# Patient Record
Sex: Female | Born: 1960 | Race: White | Hispanic: No | Marital: Single | Smoking: Never smoker
Health system: Southern US, Community
[De-identification: ages and names within clinical notes are randomized; demographics above are authoritative.]

## PROBLEM LIST (undated history)

## (undated) DIAGNOSIS — E785 Hyperlipidemia, unspecified: Secondary | ICD-10-CM

## (undated) DIAGNOSIS — B019 Varicella without complication: Secondary | ICD-10-CM

## (undated) DIAGNOSIS — R7303 Prediabetes: Secondary | ICD-10-CM

## (undated) DIAGNOSIS — N63 Unspecified lump in unspecified breast: Secondary | ICD-10-CM

## (undated) HISTORY — DX: Unspecified lump in unspecified breast: N63.0

## (undated) HISTORY — PX: BREAST LUMPECTOMY: SHX2

## (undated) HISTORY — DX: Prediabetes: R73.03

## (undated) HISTORY — DX: Hyperlipidemia, unspecified: E78.5

## (undated) HISTORY — DX: Varicella without complication: B01.9

---

## 2006-09-21 ENCOUNTER — Encounter: Admission: RE | Admit: 2006-09-21 | Discharge: 2006-09-21 | Payer: Self-pay | Admitting: Family Medicine

## 2007-01-31 ENCOUNTER — Ambulatory Visit (HOSPITAL_COMMUNITY): Admission: RE | Admit: 2007-01-31 | Discharge: 2007-01-31 | Payer: Self-pay | Admitting: General Surgery

## 2007-01-31 ENCOUNTER — Encounter (INDEPENDENT_AMBULATORY_CARE_PROVIDER_SITE_OTHER): Payer: Self-pay | Admitting: General Surgery

## 2007-06-22 DIAGNOSIS — N63 Unspecified lump in unspecified breast: Secondary | ICD-10-CM

## 2007-06-22 HISTORY — PX: BREAST LUMPECTOMY: SHX2

## 2007-06-22 HISTORY — PX: BREAST SURGERY: SHX581

## 2007-06-22 HISTORY — DX: Unspecified lump in unspecified breast: N63.0

## 2007-11-27 ENCOUNTER — Ambulatory Visit (HOSPITAL_COMMUNITY): Admission: RE | Admit: 2007-11-27 | Discharge: 2007-11-27 | Payer: Self-pay | Admitting: Obstetrics

## 2007-11-27 ENCOUNTER — Encounter (INDEPENDENT_AMBULATORY_CARE_PROVIDER_SITE_OTHER): Payer: Self-pay | Admitting: Obstetrics

## 2010-11-03 NOTE — Op Note (Signed)
NAMEBELA, BONAPARTE              ACCOUNT NO.:  [id number]   MEDICAL RECORD NO.:  [id number]          PATIENT TYPE:  OIB   LOCATION:  9305                          FACILITY:  [facility]   PHYSICIAN:  Lendon Colonel, MD   DATE OF BIRTH:  [birth year]   DATE OF PROCEDURE:  11/27/2007  DATE OF DISCHARGE:  11/27/2007                               OPERATIVE REPORT   PREOPERATIVE DIAGNOSES:  1. Symptomatic fibroids.  2. Pelvic pain.   POSTOPERATIVE DIAGNOSES:  1. Symptomatic fibroids.  2. Pelvic pain.   PROCEDURE:  Total laparoscopic hysterectomy.   SURGEON:  Lendon Colonel, MD.   ASSISTANT:  Dr. Earlene Plater.   ANESTHESIA:  General.   FINDINGS:  Large fibroid uterus.  Normal cervix, normal tubes, normal  ovaries, normal pelvis, normal liver edge, and normal gallbladder.   DISPOSITION:  Specimens to Pathology.   ESTIMATED BLOOD LOSS:  150 mL.   COMPLICATIONS:  None.   ANTIBIOTICS:  Ancef 2 g.   INDICATIONS:  This is a 50 year old female with known fibroids and  pelvic pressure from fibroids.  Risks, benefits, and alternatives to  treatment were discussed with the patient and after 3 months of Lupron  for shrinkage of fibroids, a total laparoscopic hysterectomy was  planned.   PROCEDURE:  After informed consent was obtained, the patient was taken  to the operating room, where general anesthesia was administered without  difficulty.  A Foley catheter was placed.  She was prepped and draped in  a normal sterile fashion in dorsal supine lithotomy position.  A bimanual examination was done to assess the size and position of the  uterus, limited mobility was noted.  A sterile speculum was inserted  into the vagina.  A single-tooth tenaculum was used to grasp the  anterior lip of the cervix.  The uterus was sounded to 8 cm and the RUMI  Eather Colas was inserted into the uterus.  Intrauterine balloon was employed and  the small [employer] ring was applied to the cervix.  Of note, this  procedure  was difficult due to the patient's narrow pelvis and limited  elasticity of the vagina; however, was carried out with no  complications.  A 10-mm skin incision was made in the infraumbilical  fold after infusing with 0.5% plain Marcaine.  The underlying layer of  fascia was dissected bluntly.  The fascia was grasped with Kocher clamps  x2 and the fascia was incised sharply with the knife.  The peritoneum  was entered bluntly.  The fascia was stitched with a 0 Vicryl in a  pursestring fashion and then a 10-mm Hasson trocar was inserted into the  peritoneal cavity, intraperitoneal placement was confirmed.  A  pneumoperitoneum was created with 50 mmHg and CO2 gas and the  laparoscope was introduced.  A brief survey of the patient's abdomen and  pelvis revealed a large soft fibroid uterus and limited mobility to the  uterus.  A 10-mm skin incision was made in the left lower quadrant and 6  cm below the 10-mm incision in the second left lower quadrant incision,  just medial and  superior to the anterior-superior iliac spine.  A 5-mm  skin incision was made, these were made with the scalpel after 0.5%  Marcaine and trocar placements were done under direct visualization.  Nonbladed trocars were used, the similar 5 mm right lower quadrant  trocar was placed after Marcaine.  Grasping instruments were placed  through these ports for easy visualization of the patient's abdomen and  pelvis.  The left round ligament was serially grasped and transected  with the Harmonic scalpel.  A bladder blade was created with the  Harmonic scalpel as the anterior leaf of the broad ligament was  dissected away, just superior to the bladder.  The uterine-ovarian  ligament was then serially cauterized with the bipolar cautery and then  transected with the Harmonic scalpel.  The left uterine artery was  skeletonized with the combination of the bipolar cautery and the  Harmonic scalpel.  Once the left ovary was free from  the uterus and the  uterine artery was skeletonized, attention was turned to the right  adnexa.  The Harmonic scalpel was used to serially ligate and divide the  round ligament, carried down to the anterior leaf of the broad ligaments  to create a bladder flap at the new right utero-ovarian ligament and  vessels.  The large structures were serially cauterized with bipolar  cautery prior to division with the Harmonic scalpel.  The right uterine  arteries were serially skeletonized and then cauterized with bipolar  prior to transecting with the Harmonic scalpel.  Of note, to better  mobilize the uterus, a laparoscopic tenaculum was used to place tension  on structures prior to the cautery and division.  At the level of the  cervix, the new bladder flap was created, ensured free of any attachment  and the Harmonic scalpel was used to transect the vagina just below the  external cervical os.  The Harmonic scalpel was used to divide the full-  thickness of the vagina and the incision was extended along the metal  Koh ring.  Of note, prior to beginning dissection of the vaginal  epithelium, the Koh ring was changed from the blue plastic Koh ring to  the metal Koh ring as it was apparent that we would use Harmonic scalpel  for the dissection of the vaginal cuff.  Coming from the anterior to the  left side then around to the right side, the vagina was divided just  below the level of the external cervical os.  Due to the limited  visualization of the posterior vagina and cervix, the uterus had to be  trimmed to allow more anteflexion of the uterus and to allow  visualization of the posterior vagina.  This was thought necessary to  safely make the posterior colpotomy.  Instruments were removed.  At the  left lower quadrant, a 11 mm port was removed and the morcellator was  introduced into the abdomen under direct visualization, about 6 passes  with the morcellator were done to shave off the  anterior and fundal  aspect of the uterus.  The uterus was then more mobile and the posterior  colpotomy was able to be made with good visualization.  Once the uterus  was completely transected, the uterus was pushed out through the vagina  bivalving and piecing of the uterus was done vaginally in order to  facilitate removal.  The 11-mm trocar was then placed back into the  abdomen and the morcellator was removed.  A 0 Vicryl Quill suture was  cut in 2 pieces with Lapra-Ty placed at each ends and the Quill suture  was placed through the 11 mm left lower quadrant port and a figure-of-  eight suture was placed at the apex on the left side with an additional  running suture, excess suture was trimmed.  The right apex was closed  with figure-of-eight suture with a second piece of Quill suture with a  Lapra-Ty at the end and one more suture was used to run and close the  cuff.  The cuff was noted to be completely closed and hemostatic.  All  cut vessels were found to be hemostatic.  The utero-ovarian ligaments  were reassessed and found to be hemostatic.  The vaginal cuff was  hemostatic.  The ovaries were noted to be normal with no cyst.  Of note,  the uterus were noted prior to beginning any cautery.  The pelvis was  irrigated with warmed normal saline.  The 11 mm left lower quadrant port  site was closed with a single suture of 0 Vicryl using an Endoclose  device.  The umbilical port had a pursestring sutures closed, all  pneumoperitoneum was released from the abdomen and the two 5-mm ports  were closed with Dermabond.  The two 10-mm ports were closed with  running 4-0 Vicryl in subcuticular fashion and Dermabond was  placed over these.  The Foley catheter was removed.  The vaginal  epithelium was inspected.  Several small abrasions were noted; however,  the vagina was noted to be hemostatic.  The patient was awakened from  general anesthesia and taken to the recovery room in stable  condition.      Lendon Colonel, MD  Electronically Signed     KAF/MEDQ  D:  11/27/2007  T:  11/28/2007  Job:  [id number]

## 2010-11-06 NOTE — Op Note (Signed)
Madeline Russell, Madeline Russell              ACCOUNT NO.:  [id number]   MEDICAL RECORD NO.:  [id number]          PATIENT TYPE:  AMB   LOCATION:  DAY                          FACILITY:  [facility]   PHYSICIAN:  Ollen Gross. Vernell Morgans, M.D. DATE OF BIRTH:  December 17, 1960   DATE OF PROCEDURE:  02/01/2007  DATE OF DISCHARGE:                               OPERATIVE REPORT   PREOPERATIVE DIAGNOSES:  Right breast mass.   POSTOPERATIVE DIAGNOSIS:  Right breast mass.   PROCEDURE:  Right breast lumpectomy.   SURGEON:  Ollen Gross. Vernell Morgans, M.D.   ANESTHESIA:  General.   DESCRIPTION OF PROCEDURE:  After informed consent was obtained, the  patient was brought to the operating room and placed in the supine  position on the operating table.  After adequate induction of general  anesthesia, the patient's right breast was prepped with Betadine and  draped in the usual sterile manner.  The patient had a palpable mass in  the medial aspect of the right breast at the level of the nipple.  A  transverse incision was made overlying this mass.  This incision was  carried down through the skin and into the subcutaneous tissue of the  breast sharply with electrocautery. Dissection was then carried out  around this palpable mass.  This was all done sharply with  electrocautery.  The dissection was carried down to the chest wall and  the mass was removed from the chest wall.  Once the mass was completely  separated from the rest of the subcutaneous tissue, it was removed and  sent to pathology for further evaluation.  Hemostasis was achieved using  Bovie electrocautery.  The wound was irrigated with copious amounts of  saline.  The deep layer of the wound was then closed with interrupted 3-  0 Vicryl stitches and the skin was closed with running 4-0 Monocryl  subcuticular stitch.  Benzoin, Steri-Strips and sterile dressings were  applied.  The patient tolerated the procedure well.  At the end of the  case, all needle, sponge, and  instrument counts were correct.  The  patient was then awakened and taken to recovery in stable condition.      Ollen Gross. Vernell Morgans, M.D.  Electronically Signed     PST/MEDQ  D:  02/01/2007  T:  02/02/2007  Job:  [id number]

## 2010-11-23 ENCOUNTER — Encounter (INDEPENDENT_AMBULATORY_CARE_PROVIDER_SITE_OTHER): Payer: Self-pay | Admitting: General Surgery

## 2011-02-11 ENCOUNTER — Encounter (INDEPENDENT_AMBULATORY_CARE_PROVIDER_SITE_OTHER): Payer: Self-pay | Admitting: General Surgery

## 2011-02-11 ENCOUNTER — Ambulatory Visit (INDEPENDENT_AMBULATORY_CARE_PROVIDER_SITE_OTHER): Payer: 59 | Admitting: General Surgery

## 2011-02-11 VITALS — BP 104/80 | HR 68

## 2011-02-11 DIAGNOSIS — Z853 Personal history of malignant neoplasm of breast: Secondary | ICD-10-CM

## 2011-02-11 NOTE — Patient Instructions (Signed)
Continue regular self exams  

## 2011-02-16 NOTE — Progress Notes (Signed)
Subjective:     Patient ID: Madeline Russell, female   DOB: [birth year], 50 y.o.   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 Her last mammogram was in July and appeared benign. Since her last visit she has had no complaints. She denies any breast pain or discharge from her nipple. Review of Systems     Objective:   Physical Exam On exam Lungs: Clear bilaterally with no use of accessory respiratory muscles Heart: Regular rate and rhythm with an impulse in the left chest Abdomen: Soft and nontender with no palpable mass or hepatosplenomegaly Breasts: No palpable mass in either breast. No axillary supraclavicular or cervical lymphadenopathy.    Assessment:     4 years postop from excision of a phylloides tumor    Plan:     She will continue to perform regular self exams. We will plan to see her back on a yearly basis. She agrees to call associate has any problems in the meantime.

## 2011-03-18 LAB — PREGNANCY, URINE

## 2011-03-18 LAB — ABO/RH: ABO/RH(D): A POS

## 2011-03-18 LAB — TYPE AND SCREEN
ABO/RH(D): A POS
Antibody Screen: NEGATIVE

## 2011-03-18 LAB — BASIC METABOLIC PANEL
Calcium: 9.6
GFR calc non Af Amer: >60
Potassium: 4
Sodium: 138

## 2011-03-18 LAB — CBC
RBC: 4.8
WBC: 4.4

## 2011-08-03 ENCOUNTER — Emergency Department (HOSPITAL_COMMUNITY)
Admission: EM | Admit: 2011-08-03 | Discharge: 2011-08-03 | Disposition: A | Discharge status: Home / Self Care | Payer: Worker's Compensation | Attending: Emergency Medicine | Admitting: Emergency Medicine

## 2011-08-03 ENCOUNTER — Emergency Department (HOSPITAL_COMMUNITY): Payer: Worker's Compensation

## 2011-08-03 ENCOUNTER — Encounter (HOSPITAL_COMMUNITY): Payer: Self-pay

## 2011-08-03 DIAGNOSIS — Y990 Civilian activity done for income or pay: Secondary | ICD-10-CM | POA: Insufficient documentation

## 2011-08-03 DIAGNOSIS — S0990XA Unspecified injury of head, initial encounter: Secondary | ICD-10-CM | POA: Insufficient documentation

## 2011-08-03 DIAGNOSIS — IMO0002 Reserved for concepts with insufficient information to code with codable children: Secondary | ICD-10-CM | POA: Insufficient documentation

## 2011-08-03 MED ORDER — HYDROCODONE-ACETAMINOPHEN 5-325 MG PO TABS
1.0000 | ORAL_TABLET | Freq: Once | ORAL | Status: AC
  Administered 2011-08-03: 1 via ORAL
  Filled 2011-08-03: qty 1

## 2011-08-03 MED ORDER — HYDROCODONE-ACETAMINOPHEN 5-325 MG PO TABS: 2.0000 | ORAL_TABLET | ORAL | Status: AC | PRN

## 2011-08-03 MED ORDER — IBUPROFEN 800 MG PO TABS
800.0000 mg | ORAL_TABLET | Freq: Once | ORAL | Status: AC
  Administered 2011-08-03: 800 mg via ORAL
  Filled 2011-08-03: qty 1
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
## 2011-08-03 NOTE — ED Notes (Signed)
Pt has been marked "confidential" however pt has said that her brother, Thayer Ohm may be calling and it's ok for him to know she's here.

## 2011-08-03 NOTE — Discharge Instructions (Signed)
Please read over the instructions below. The Ct of your head tonight is normal. Your exam is also normal. We are prescribing a short course of medication for pain to use, if needed,  over the next couple of days as you may be very sore tomorrow or the next day. Return if you have worsening symptoms, otherwise follow up with Dr.Hunt per the recommendations of your  Worker's Comp guidelines.   Head Injury, Adult  You have had a head injury that does not appear serious at this time. A concussion is a state of changed mental ability, usually from a blow to the head. You should take clear liquids for the rest of the day and then resume your regular diet. You should not take sedatives or alcoholic beverages for as long as directed by your caregiver after discharge. After injuries such as yours, most problems occur within the first 24 hours. SYMPTOMS These minor symptoms may be experienced after discharge:  Memory difficulties.   Dizziness.   Headaches.   Double vision.   Hearing difficulties.   Depression.   Tiredness.   Weakness.   Difficulty with concentration.  If you experience any of these problems, you should not be alarmed. A concussion requires a few days for recovery. Many patients with head injuries frequently experience such symptoms. Usually, these problems disappear without medical care. If symptoms last for more than one day, notify your caregiver. See your caregiver sooner if symptoms are becoming worse rather than better. HOME CARE INSTRUCTIONS   During the next 24 hours you must stay with someone who can watch you for the warning signs listed below.  Although it is unlikely that serious side effects will occur, you should be aware of signs and symptoms which may necessitate your return to this location. Side effects may occur up to 7 - 10 days following the injury. It is important for you to carefully monitor your condition and contact your caregiver or seek immediate medical  attention if there is a change in your condition. SEEK IMMEDIATE MEDICAL CARE IF:   There is confusion or drowsiness.   You can not awaken the injured person.   There is nausea (feeling sick to your stomach) or continued, forceful vomiting.   You notice dizziness or unsteadiness which is getting worse, or inability to walk.   You have convulsions or unconsciousness.   You experience severe, persistent headaches not relieved by over-the-counter or prescription medicines for pain. (Do not take aspirin as this impairs clotting abilities). Take other pain medications only as directed.   You can not use arms or legs normally.   There is clear or bloody discharge from the nose or ears.  MAKE SURE YOU:   Understand these instructions.   Will watch your condition.   Will get help right away if you are not doing well or get worse.  Document Released: 06/07/2005 Document Revised: 02/17/2011 Document Reviewed: 04/25/2009 [facility] Patient Information 2012 [place], [place].
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
## 2011-08-04 NOTE — ED Provider Notes (Signed)
Medical screening examination/treatment/procedure(s) were performed by non-physician practitioner and as supervising physician I was immediately available for consultation/collaboration.  Raeford Razor, MD 08/04/11 [phone]

## 2011-12-01 ENCOUNTER — Encounter (INDEPENDENT_AMBULATORY_CARE_PROVIDER_SITE_OTHER): Payer: Self-pay | Admitting: General Surgery

## 2012-01-24 ENCOUNTER — Ambulatory Visit (INDEPENDENT_AMBULATORY_CARE_PROVIDER_SITE_OTHER): Payer: 59 | Admitting: General Surgery

## 2012-01-24 ENCOUNTER — Encounter (INDEPENDENT_AMBULATORY_CARE_PROVIDER_SITE_OTHER): Payer: Self-pay | Admitting: General Surgery

## 2012-01-24 VITALS — BP 102/60 | HR 84 | Temp 97.0°F | Resp 20 | Ht 66.75 in | Wt 172.6 lb

## 2012-01-24 DIAGNOSIS — Z853 Personal history of malignant neoplasm of breast: Secondary | ICD-10-CM

## 2012-01-24 NOTE — Progress Notes (Signed)
Subjective:     Patient ID: Madeline Russell, female   DOB: 01/26/1961, 51 y.o.   MRN: [id number]  HPI The patient is a 51 year old white female who is about 5 years out from a right breast lumpectomy for a low-grade phylloides tumor. Since her last visit she has been well. She denies any breast pain. She denies any discharge from her nipple. She recently had a mammogram that showed no evidence of malignancy.  Review of Systems  Constitutional: Negative.   HENT: Negative.   Eyes: Negative.   Respiratory: Negative.   Cardiovascular: Negative.   Gastrointestinal: Negative.   Genitourinary: Negative.   Musculoskeletal: Negative.   Skin: Negative.   Neurological: Negative.   Hematological: Negative.   Psychiatric/Behavioral: Negative.        Objective:   Physical Exam  Constitutional: She is oriented to person, place, and time. She appears well-developed and well-nourished.  HENT:  Head: Normocephalic and atraumatic.  Eyes: Conjunctivae and EOM are normal. Pupils are equal, round, and reactive to light.  Neck: Normal range of motion. Neck supple.  Cardiovascular: Normal rate, regular rhythm and normal heart sounds.   Pulmonary/Chest: Effort normal and breath sounds normal.       Her right breast incision is healing nicely. There is no palpable mass in either breast. No palpable axillary supraclavicular or cervical lymphadenopathy.  Abdominal: Soft. Bowel sounds are normal. She exhibits no mass. There is no tenderness.  Musculoskeletal: Normal range of motion.  Lymphadenopathy:    She has no cervical adenopathy.  Neurological: She is alert and oriented to person, place, and time.  Skin: Skin is warm and dry.  Psychiatric: She has a normal mood and affect. Her behavior is normal.       Assessment:     5 years status post right lumpectomy for a low-grade phylloides tumor    Plan:     At this point she will continue to do regular self exams. We will plan to see her back in  one year

## 2012-01-24 NOTE — Patient Instructions (Signed)
Continue regular self exams  

## 2012-12-13 ENCOUNTER — Encounter (INDEPENDENT_AMBULATORY_CARE_PROVIDER_SITE_OTHER): Payer: Self-pay | Admitting: General Surgery

## 2013-01-31 ENCOUNTER — Encounter (INDEPENDENT_AMBULATORY_CARE_PROVIDER_SITE_OTHER): Payer: Self-pay

## 2013-02-13 ENCOUNTER — Ambulatory Visit (INDEPENDENT_AMBULATORY_CARE_PROVIDER_SITE_OTHER): Payer: 59 | Admitting: General Surgery

## 2013-02-15 ENCOUNTER — Encounter (INDEPENDENT_AMBULATORY_CARE_PROVIDER_SITE_OTHER): Payer: Self-pay | Admitting: General Surgery

## 2013-02-15 ENCOUNTER — Ambulatory Visit (INDEPENDENT_AMBULATORY_CARE_PROVIDER_SITE_OTHER): Payer: 59 | Admitting: General Surgery

## 2013-02-15 DIAGNOSIS — Z853 Personal history of malignant neoplasm of breast: Secondary | ICD-10-CM

## 2013-02-15 NOTE — Patient Instructions (Signed)
Continue regular self exams  
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
## 2013-05-14 ENCOUNTER — Emergency Department (HOSPITAL_BASED_OUTPATIENT_CLINIC_OR_DEPARTMENT_OTHER)
Admission: EM | Admit: 2013-05-14 | Discharge: 2013-05-14 | Disposition: A | Discharge status: Home / Self Care | Payer: 59 | Attending: Emergency Medicine | Admitting: Emergency Medicine

## 2013-05-14 ENCOUNTER — Other Ambulatory Visit: Payer: Self-pay

## 2013-05-14 ENCOUNTER — Emergency Department (HOSPITAL_BASED_OUTPATIENT_CLINIC_OR_DEPARTMENT_OTHER): Payer: 59

## 2013-05-14 ENCOUNTER — Encounter (HOSPITAL_BASED_OUTPATIENT_CLINIC_OR_DEPARTMENT_OTHER): Payer: Self-pay | Admitting: Emergency Medicine

## 2013-05-14 DIAGNOSIS — Z8742 Personal history of other diseases of the female genital tract: Secondary | ICD-10-CM | POA: Insufficient documentation

## 2013-05-14 DIAGNOSIS — Z79899 Other long term (current) drug therapy: Secondary | ICD-10-CM | POA: Insufficient documentation

## 2013-05-14 DIAGNOSIS — R0789 Other chest pain: Secondary | ICD-10-CM | POA: Insufficient documentation

## 2013-05-14 LAB — CBC WITH DIFFERENTIAL/PLATELET
Basophils Absolute: 0 10*3/uL (ref 0.0–0.1)
Eosinophils Relative: 2 % (ref 0–5)
HCT: 40 % (ref 36.0–46.0)
Lymphocytes Relative: 49 % — ABNORMAL HIGH (ref 12–46)
MCH: 31.5 pg (ref 26.0–34.0)
MCHC: 33.8 g/dL (ref 30.0–36.0)
MCV: 93.2 fL (ref 78.0–100.0)
Monocytes Absolute: 0.4 10*3/uL (ref 0.1–1.0)
RDW: 12.1 % (ref 11.5–15.5)
WBC: 4.6 10*3/uL (ref 4.0–10.5)

## 2013-05-14 LAB — BASIC METABOLIC PANEL
CO2: 25 mEq/L (ref 19–32)
Calcium: 9.2 mg/dL (ref 8.4–10.5)
Creatinine, Ser: 0.7 mg/dL (ref 0.50–1.10)

## 2013-05-14 LAB — TROPONIN I: Troponin I: <0.3 ng/mL (ref <0.30)

## 2013-05-14 MED ORDER — PANTOPRAZOLE SODIUM 40 MG IV SOLR
40.0000 mg | Freq: Once | INTRAVENOUS | Status: AC
  Administered 2013-05-14: 40 mg via INTRAVENOUS
  Filled 2013-05-14: qty 40

## 2013-05-14 MED ORDER — OMEPRAZOLE 20 MG PO CPDR: 20.0000 mg | DELAYED_RELEASE_CAPSULE | Freq: Every day | ORAL | Status: DC

## 2013-05-14 MED ORDER — GI COCKTAIL ~~LOC~~
30.0000 mL | Freq: Once | ORAL | Status: AC
  Administered 2013-05-14: 30 mL via ORAL
  Filled 2013-05-14: qty 30

## 2013-05-14 MED ORDER — ASPIRIN 81 MG PO CHEW
324.0000 mg | CHEWABLE_TABLET | Freq: Once | ORAL | Status: AC
  Administered 2013-05-14: 324 mg via ORAL
  Filled 2013-05-14: qty 4

## 2013-05-14 NOTE — ED Notes (Signed)
Patient transported to X-ray via stretcher per tech. 

## 2013-05-14 NOTE — ED Provider Notes (Signed)
CSN: [id number]     Arrival date & time 05/14/13  0108 History   First MD Initiated Contact with Patient 05/14/13 0126     Chief Complaint  Patient presents with  . Chest Pain   (Consider location/radiation/quality/duration/timing/severity/associated sxs/prior Treatment) HPI 52 year old female with a two-day history of intermittent chest pain. She describes the chest pain as feeling like heartburn with a sharp component. It is substernal with some radiation to the back. It is worse with swallowing. It is not exacerbated by exertion or relieved by rest. The symptoms are mild to moderate at worst. She is also having some paresthesias of the left arm. She denies shortness of breath, diaphoresis, nausea or vomiting. She did take 81 mg of aspirin 2 days ago.  Past Medical History  Diagnosis Date  . Breast lump 2009    right   Past Surgical History  Procedure Laterality Date  . Breast surgery  2009    RIGHT  . Breast lumpectomy      NOT CANCER  . Breast lumpectomy  2009    right   Family History  Problem Relation Age of Onset  . Cancer Mother     leukemia  . Heart disease Father    History  Substance Use Topics  . Smoking status: Never Smoker   . Smokeless tobacco: Never Used  . Alcohol Use: No   OB History   Grav Para Term Preterm Abortions TAB SAB Ect Mult Living                 Review of Systems  All other systems reviewed and are negative.    Allergies  Review of patient's allergies indicates no known allergies.  Home Medications   Current Outpatient Rx  Name  Route  Sig  Dispense  Refill  . cholecalciferol (VITAMIN D) 1000 UNITS tablet   Oral   Take 1,000 Units by mouth daily.         . diphenhydrAMINE (BENADRYL) 25 MG tablet   Oral   Take 25 mg by mouth every 6 (six) hours as needed. For allergies.         Marland Kitchen loratadine (CLARITIN) 10 MG tablet   Oral   Take 10 mg by mouth daily as needed. For allergies.         . Multiple Vitamin (MULITIVITAMIN  WITH MINERALS) TABS   Oral   Take 1 tablet by mouth daily.          BP 142/86  Pulse 62  Temp(Src) 97.9 F (36.6 C) (Oral)  Resp 18  Ht 5\' 7"  (1.702 m)  Wt 160 lb (72.576 kg)  BMI 25.05 kg/m2  SpO2 98%  Physical Exam General: Well-developed, well-nourished female in no acute distress; appearance consistent with age of record HENT: normocephalic; atraumatic Eyes: pupils equal, round and reactive to light; extraocular muscles intact Neck: supple Heart: regular rate and rhythm; no murmurs, rubs or gallops Lungs: clear to auscultation bilaterally Abdomen: soft; nondistended; nontender; bowel sounds present Extremities: No deformity; full range of motion; pulses normal; no edema Neurologic: Awake, alert and oriented; motor function intact in all extremities and symmetric; no facial droop Skin: Warm and dry Psychiatric: Normal mood and affect    ED Course  Procedures (including critical care time)   MDM  Nursing notes and vitals signs, including pulse oximetry, reviewed.  Summary of this visit's results, reviewed by myself:  Labs:  Results for orders placed during the hospital encounter of 05/14/13 (from the past 24  hour(s))  CBC WITH DIFFERENTIAL     Status: Abnormal   Collection Time    05/14/13  1:33 AM      Result Value Range   WBC 4.6  4.0 - 10.5 K/uL   RBC 4.29  3.87 - 5.11 MIL/uL   Hemoglobin 13.5  12.0 - 15.0 g/dL   HCT 29.5  62.1 - 30.8 %   MCV 93.2  78.0 - 100.0 fL   MCH 31.5  26.0 - 34.0 pg   MCHC 33.8  30.0 - 36.0 g/dL   RDW 65.7  84.6 - 96.2 %   Platelets 265  150 - 400 K/uL   Neutrophils Relative % 40 (*) 43 - 77 %   Neutro Abs 1.9  1.7 - 7.7 K/uL   Lymphocytes Relative 49 (*) 12 - 46 %   Lymphs Abs 2.2  0.7 - 4.0 K/uL   Monocytes Relative 9  3 - 12 %   Monocytes Absolute 0.4  0.1 - 1.0 K/uL   Eosinophils Relative 2  0 - 5 %   Eosinophils Absolute 0.1  0.0 - 0.7 K/uL   Basophils Relative 1  0 - 1 %   Basophils Absolute 0.0  0.0 - 0.1 K/uL   BASIC METABOLIC PANEL     Status: Abnormal   Collection Time    05/14/13  1:33 AM      Result Value Range   Sodium 140  135 - 145 mEq/L   Potassium 3.7  3.5 - 5.1 mEq/L   Chloride 104  96 - 112 mEq/L   CO2 25  19 - 32 mEq/L   Glucose, Bld 111 (*) 70 - 99 mg/dL   BUN 13  6 - 23 mg/dL   Creatinine, Ser 9.52  0.50 - 1.10 mg/dL   Calcium 9.2  8.4 - 84.1 mg/dL   GFR calc non Af Amer >90  >90 mL/min   GFR calc Af Amer >90  >90 mL/min  TROPONIN I     Status: None   Collection Time    05/14/13  1:33 AM      Result Value Range   Troponin I <0.30  <0.30 ng/mL    Imaging Studies: Dg Chest 2 View  05/14/2013   CLINICAL DATA:  Substernal chest pain radiating to left arm.  EXAM: CHEST  2 VIEW  COMPARISON:  None.  FINDINGS: The heart size and mediastinal contours are within normal limits. Both lungs are clear. The visualized skeletal structures are unremarkable.  IMPRESSION: No active cardiopulmonary disease.   Electronically Signed   By: Myles Rosenthal M.D.   On: 05/14/2013 01:47      EKG Interpretation    Date/Time:  Monday May 14 2013 01:23:23 EST Ventricular Rate:  69 PR Interval:  148 QRS Duration: 76 QT Interval:  418 QTC Calculation: 447 R Axis:   52 Text Interpretation:  Normal sinus rhythm Normal ECG No previous ECGs available Confirmed by Kennieth Plotts  MD, Jerrica Thorman ([id number]) on 05/14/2013 1:29:17 AM     2:12 AM Significant relief with GI cocktail.  2:42 AM History is atypical for coronary etiology. She has no risk factors apart from coronary artery disease in her family. We'll treat for GERD and refer to [facility] cardiology for followup stress test. She was advised to return for worsening or changing symptoms.   Hanley Seamen, MD 05/14/13 [phone]

## 2013-05-14 NOTE — ED Notes (Signed)
MD at bedside. 

## 2013-05-14 NOTE — ED Notes (Signed)
Pt reports 2 days of intermittent cp substernal initially and then today radiated to left side and down left arm with numbness.  Denies additional symptoms.  Reports that when she had a drink today felt the pain radiating down the chest.

## 2014-02-07 ENCOUNTER — Other Ambulatory Visit: Payer: Self-pay | Admitting: Gastroenterology

## 2014-02-07 DIAGNOSIS — R110 Nausea: Secondary | ICD-10-CM

## 2014-02-12 ENCOUNTER — Ambulatory Visit
Admission: RE | Admit: 2014-02-12 | Discharge: 2014-02-12 | Disposition: A | Discharge status: Home / Self Care | Payer: 59 | Source: Ambulatory Visit | Attending: Gastroenterology | Admitting: Gastroenterology

## 2014-02-12 DIAGNOSIS — R110 Nausea: Secondary | ICD-10-CM

## 2014-02-18 ENCOUNTER — Encounter: Payer: Self-pay | Admitting: General Surgery

## 2014-02-21 ENCOUNTER — Ambulatory Visit (INDEPENDENT_AMBULATORY_CARE_PROVIDER_SITE_OTHER): Payer: 59 | Admitting: General Surgery

## 2015-06-08 IMAGING — US US ABDOMEN COMPLETE
1 series · 14 of 25 positions shown · non-contrast
Comparison: None.

CLINICAL DATA: Nausea.  Acid reflux.

EXAM:
ULTRASOUND ABDOMEN COMPLETE

[Series 1: us abdomen complete · 0.24mm/px · 14 of 80 slices shown]
[im 1/80]
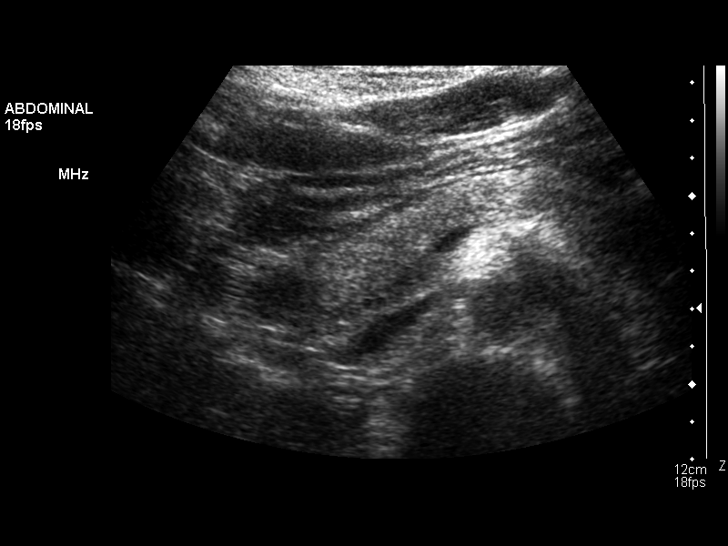
[im 7/80]
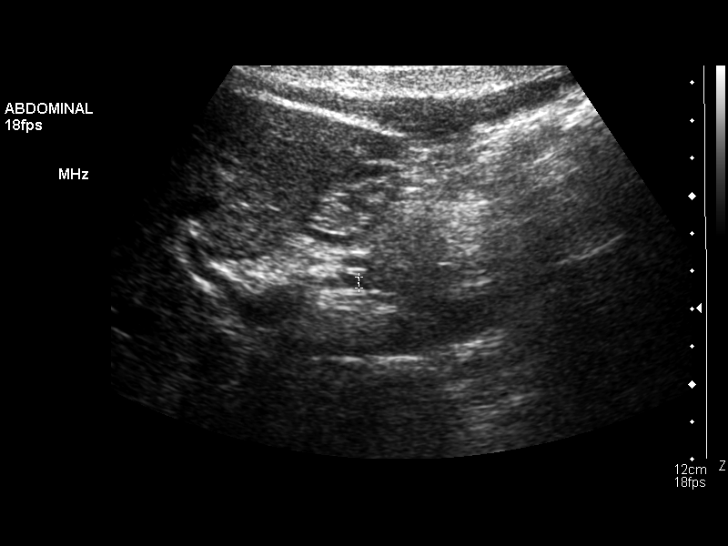
[im 14/80]
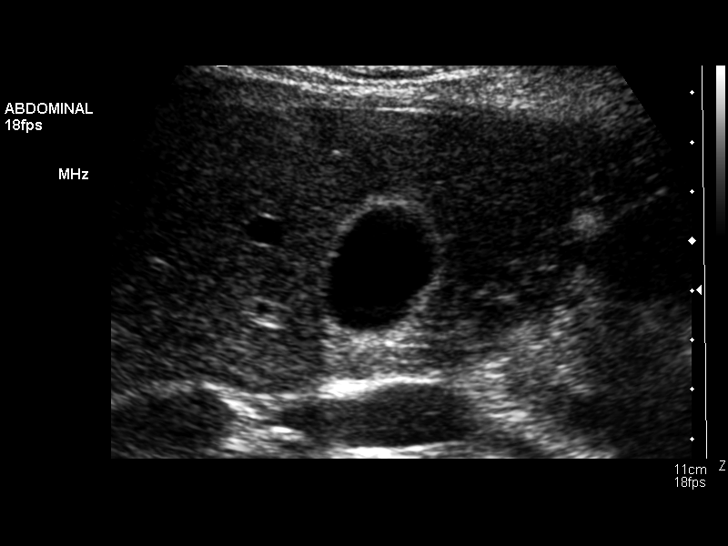
[im 20/80]
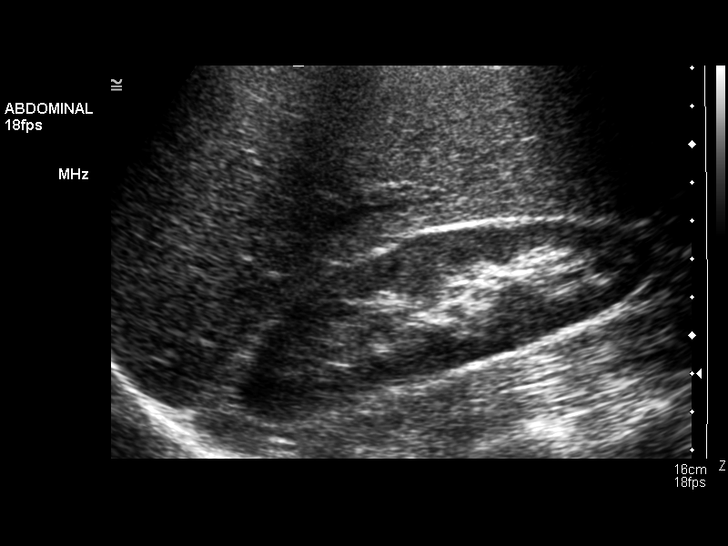
[im 27/80]
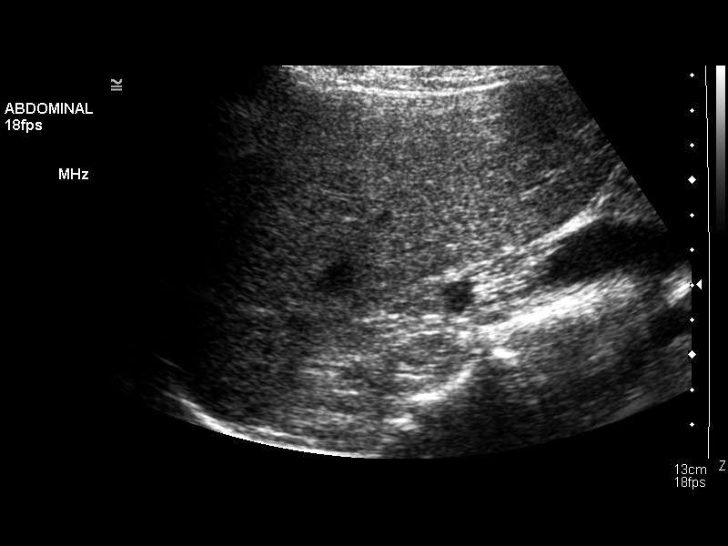
[im 30/80]
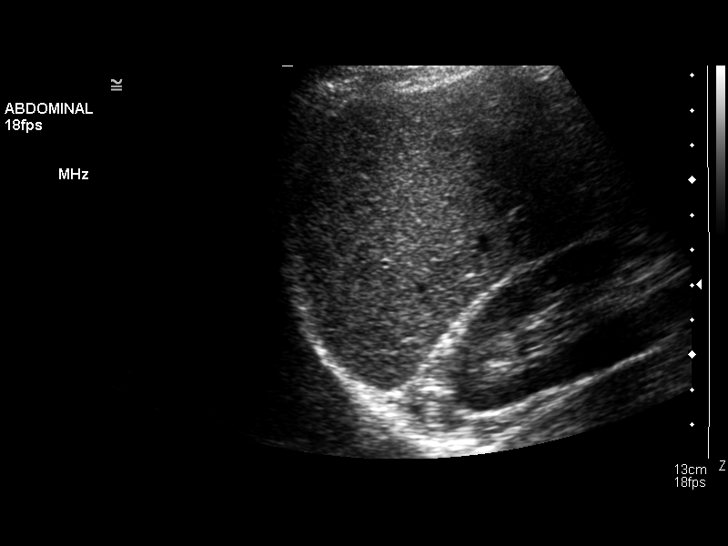
[im 37/80]
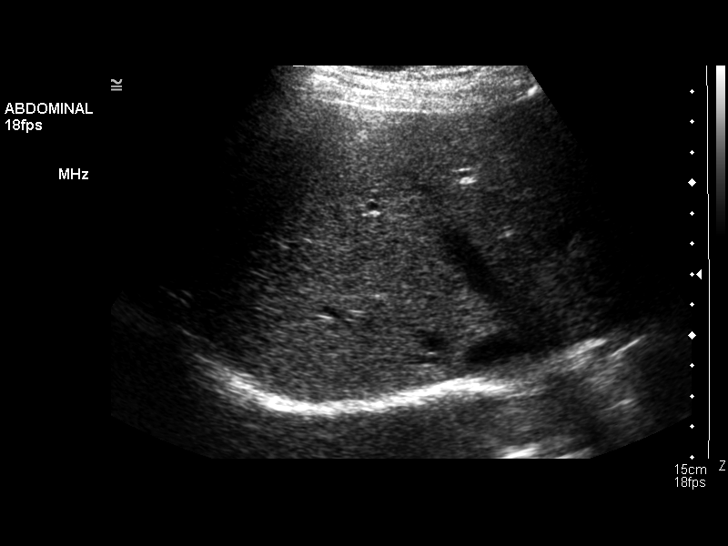
[im 43/80]
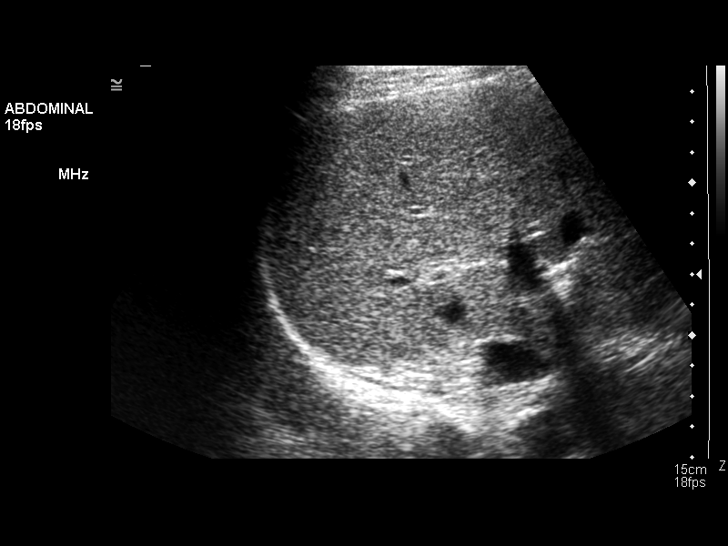
[im 50/80]
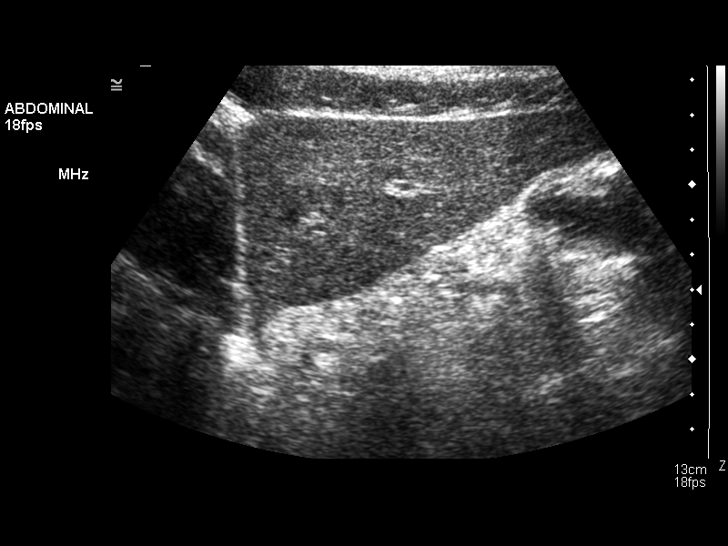
[im 53/80]
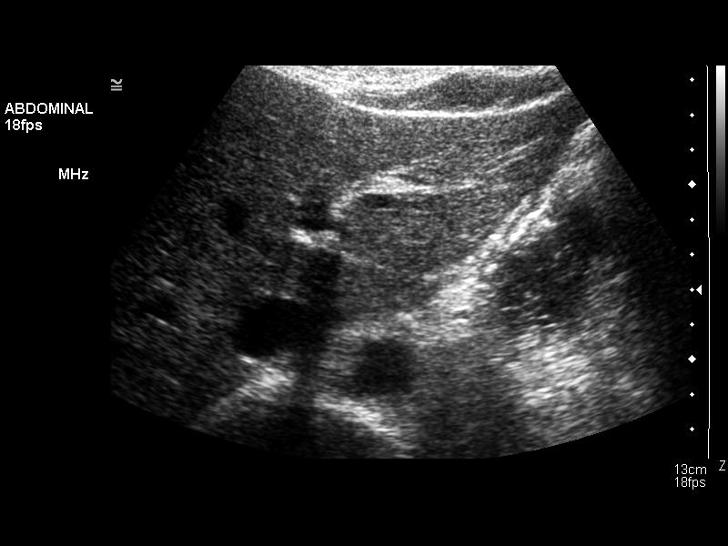
[im 60/80]
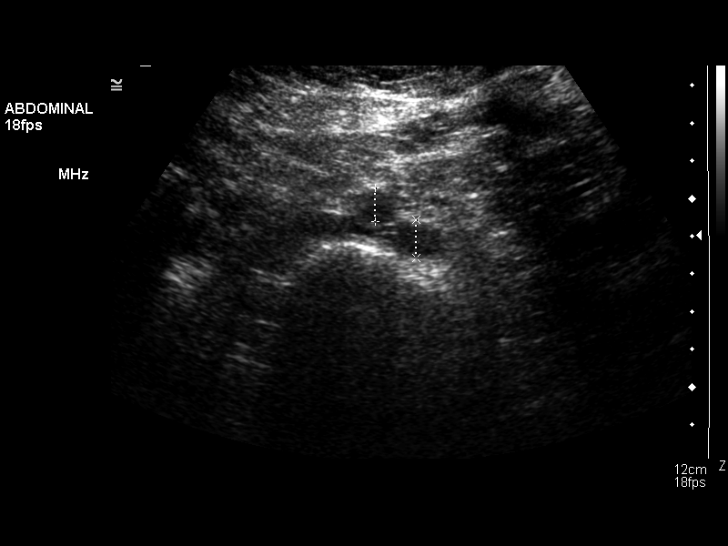
[im 66/80]
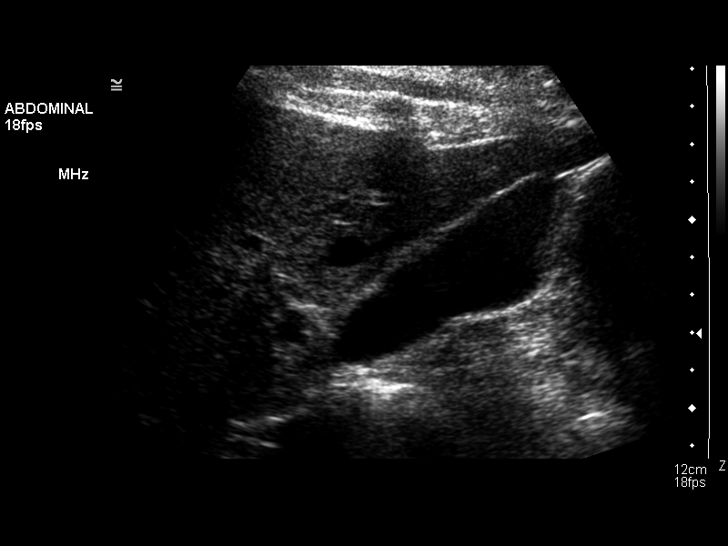
[im 73/80]
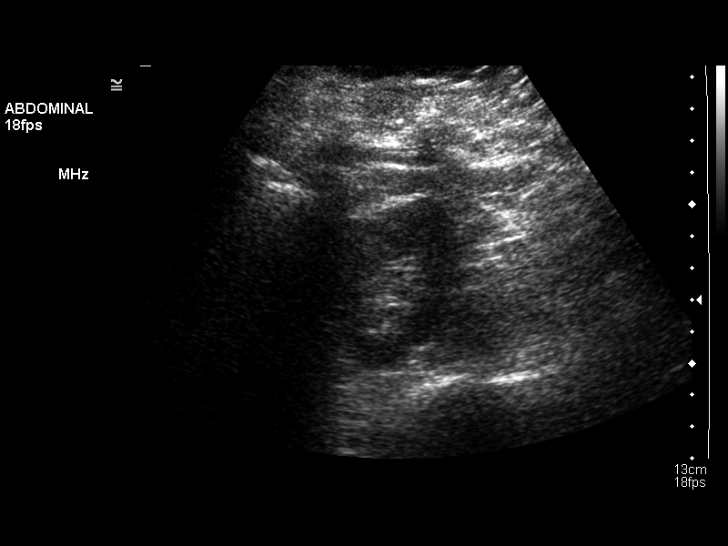
[im 80/80]
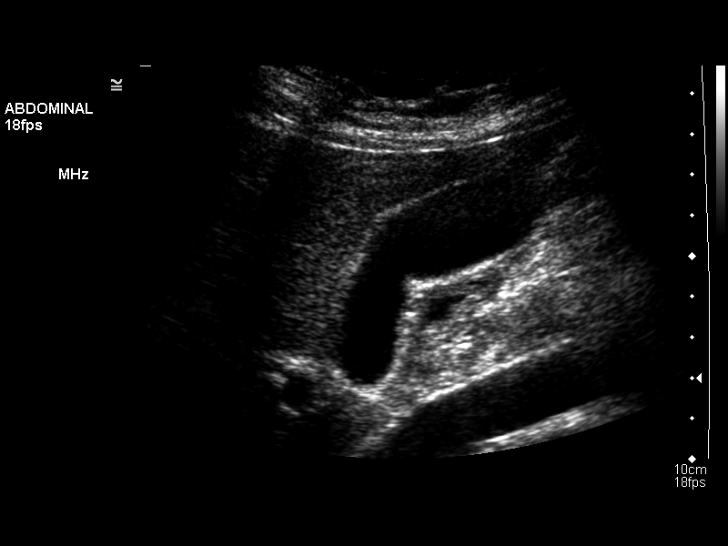

[14 of 25 positions shown; findings below may reference images not displayed]

FINDINGS: Gallbladder:

No gallstones or wall thickening visualized. No sonographic Murphy
sign noted.

Common bile duct:

Diameter: 2.9 cm.

Liver:

No focal lesion identified. Within normal limits in parenchymal
echogenicity.

IVC:

No abnormality visualized.

Pancreas:

Visualized portion unremarkable.

Spleen:

Size and appearance within normal limits.

Right Kidney:

Length: 11.6 cm. Echogenicity within normal limits. No mass or
hydronephrosis visualized.

Left Kidney:

Length: 11.5 cm.. Echogenicity within normal limits. No mass or
hydronephrosis visualized.

Abdominal aorta:

No aneurysm visualized.

Other findings:

None.
IMPRESSION: 1. No acute findings.
2. Normal exam.

## 2015-08-18 ENCOUNTER — Encounter: Payer: Self-pay | Admitting: Dietician

## 2015-08-18 ENCOUNTER — Encounter: Payer: Commercial Managed Care - HMO | Attending: Obstetrics | Admitting: Dietician

## 2015-08-18 VITALS — Ht 67.0 in | Wt 160.0 lb

## 2015-08-18 DIAGNOSIS — R739 Hyperglycemia, unspecified: Secondary | ICD-10-CM

## 2015-08-18 DIAGNOSIS — R7309 Other abnormal glucose: Secondary | ICD-10-CM | POA: Diagnosis present

## 2015-08-18 DIAGNOSIS — E785 Hyperlipidemia, unspecified: Secondary | ICD-10-CM

## 2015-08-18 NOTE — Patient Instructions (Signed)
Plan:  Aim for 3 Carb Choices per meal (45 grams) +/- 1 either way  Aim for 0-1 Carbs per snack if hungry  Include protein in moderation with your meals and snacks Consider reading food labels for Total Carbohydrate and Fat Grams of foods Consider  increasing your activity level by walking for 30 minutes daily as tolerated Great job on the changes that you have made!

## 2015-08-18 NOTE — Progress Notes (Signed)
  Medical Nutrition Therapy:  Appt start time: 1400 end time:  1500.   Assessment:  Primary concerns today: Patient is here with her friend.  She would like information to avoid diabetes.  Referral for elevated HgbA1C (6.1% increased from 5.8% 1 year ago), and elevated lipids:  Cholesterol 241, Triglycerides 86, HDL 62, LDL 162.  Vitamin D 34.5.  Since Madeline Russell found out about her labs, she has made multiple changes such as increasing her exercise and avoiding all sugar as well as reducing her carbohydrate intake.  She has lost from 170 lbs 07/15/15 to 160 lbs today.  "I am now eating to live."  "I do not want diabetes."  Patient lives alone.   She is very active.  She does her own shopping and cooking.  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    Preferred Learning Style:   No preference indicated   Learning Readiness:   Ready  Change in progress  MEDICATIONS: see list   DIETARY INTAKE:  Usual eating pattern includes 3 meals and 1-2 snacks per day. Everyday foods include baked.  Avoided foods include sugar.    24-hr recall:  B ( AM): black coffee with 1 cup plain cheerios and 12 honey nut cheerios with whole milk  Snk ( AM): none  L ( PM): Double fiber wheat bread with [place], lettuce, and mustard and small amount of cheese Snk ( PM): greek yogurt or apple D ( PM): salmon or chicken grilled, vegetables, salad Snk ( PM): none or occasional yogurt or peanuts Beverages: black coffee, diet lipton tea, water, mostly unsweetened tea  Usual physical activity: walks 15 minutes and runs 15 minutes, hikes and started to work out.  Estimated energy needs: 1600 calories 180 g carbohydrates 120 g protein 44 g fat  Progress Towards Goal(s):  In progress.   Nutritional Diagnosis:  NB-1.1 Food and nutrition-related knowledge deficit As related to balance of carbohydrates, protein, and fat.  As evidenced by patient  report due to newly increase lipids and glucose..    Intervention:  Nutrition counseling/education for increased lipids (decreased saturated fat), and diabetes education initiated. Discussed Carb Counting by food group as method of portion control, reading food labels, and benefits of increased activity. Also discussed basic physiology of Diabetes.   Plan:  Aim for 3 Carb Choices per meal (45 grams) +/- 1 either way  Aim for 0-1 Carbs per snack if hungry  Include protein in moderation with your meals and snacks Consider reading food labels for Total Carbohydrate and Fat Grams of foods Consider  increasing your activity level by walking for 30 minutes daily as tolerated Great job on the changes that you have made!  Teaching Method Utilized:  Visual Auditory Hands on  Handouts given during visit include: Living Well with Diabetes Carb Counting and Food Label handouts Meal Plan Card Label reading  My plate  Barriers to learning/adherence to lifestyle change: none  Demonstrated degree of understanding via:  Teach Back   Monitoring/Evaluation:  Dietary intake, exercise, label reading, and body weight prn.

## 2018-02-02 ENCOUNTER — Other Ambulatory Visit: Payer: Self-pay | Admitting: General Surgery

## 2018-02-02 DIAGNOSIS — R1011 Right upper quadrant pain: Secondary | ICD-10-CM

## 2018-03-02 ENCOUNTER — Ambulatory Visit
Admission: RE | Admit: 2018-03-02 | Discharge: 2018-03-02 | Disposition: A | Discharge status: Home / Self Care | Payer: 59 | Source: Ambulatory Visit | Attending: General Surgery | Admitting: General Surgery

## 2018-03-02 DIAGNOSIS — R1011 Right upper quadrant pain: Secondary | ICD-10-CM

## 2020-02-11 IMAGING — US US ABDOMEN LIMITED
1 series · 14 of 25 positions shown · non-contrast
Comparison: 02/12/2014

CLINICAL DATA: Right upper quadrant pain for several weeks

EXAM:
ULTRASOUND ABDOMEN LIMITED RIGHT UPPER QUADRANT

[Series 1: us abdomen limited · 0.17mm/px · 14 of 52 slices shown]
[im 1/52]
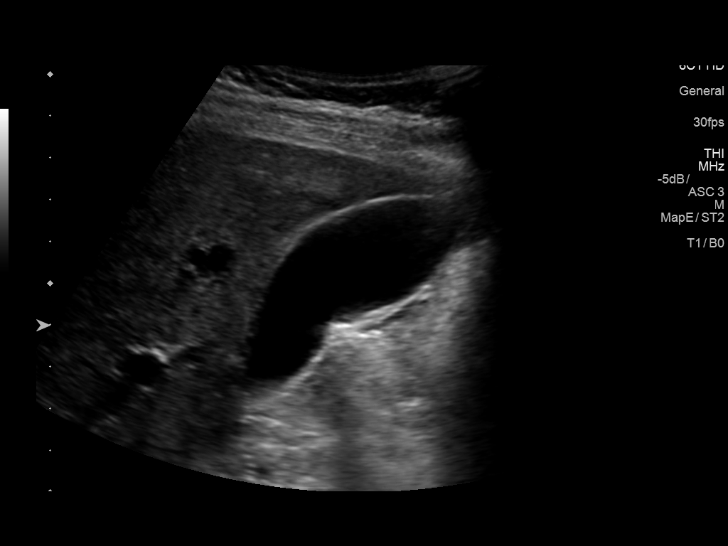
[im 5/52]
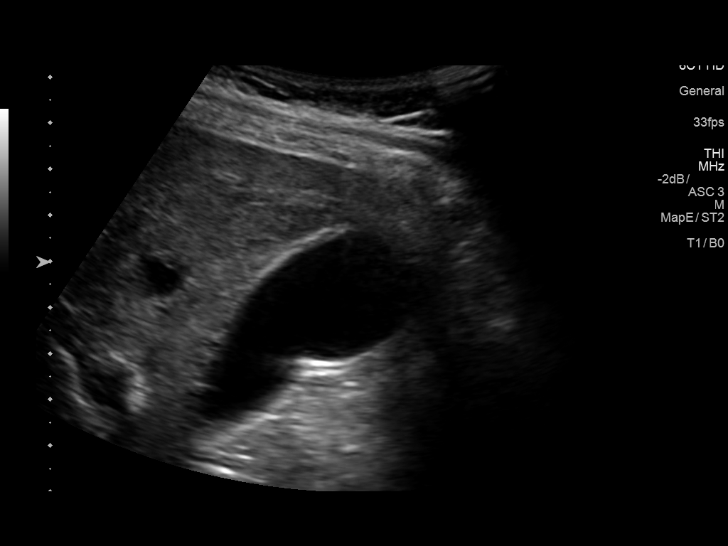
[im 9/52]
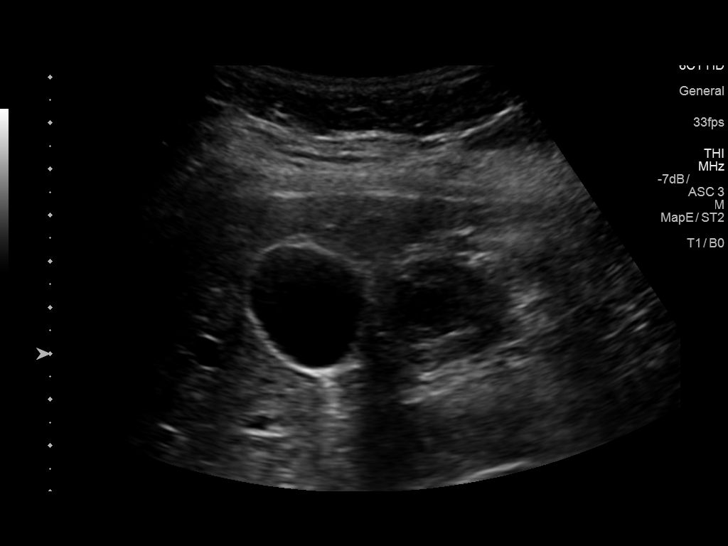
[im 13/52]
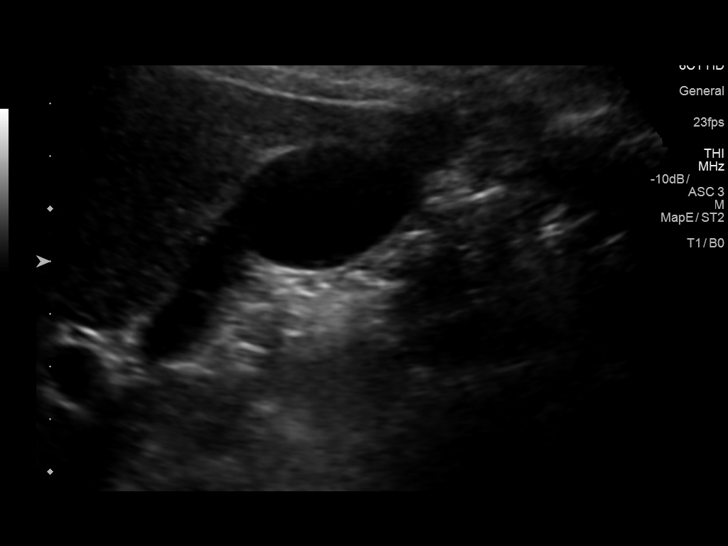
[im 18/52]
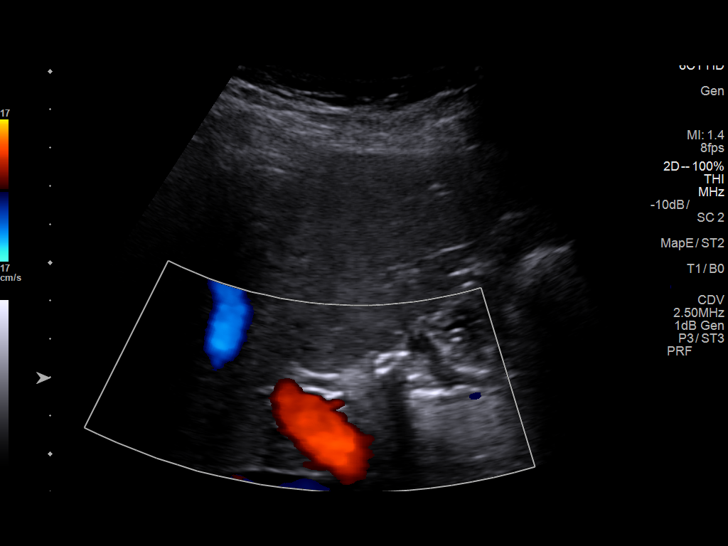
[im 20/52]
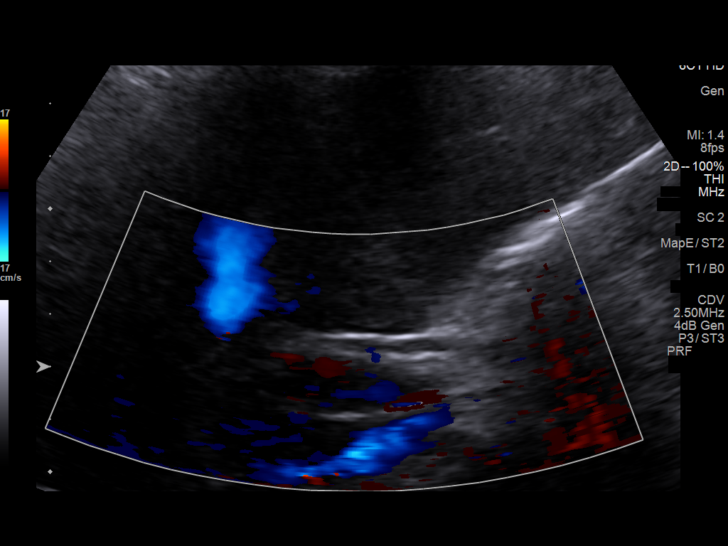
[im 24/52]
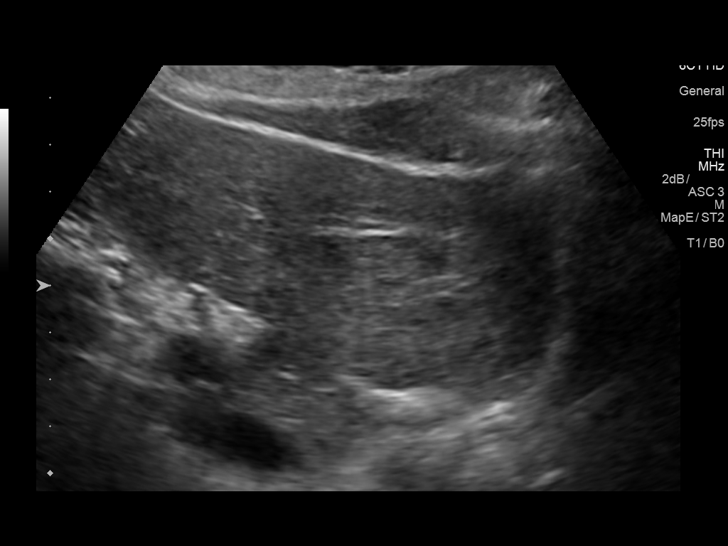
[im 28/52]
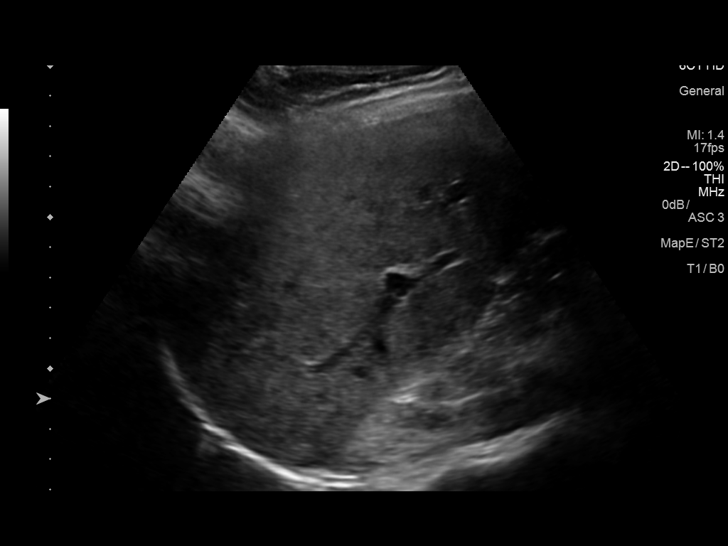
[im 32/52]
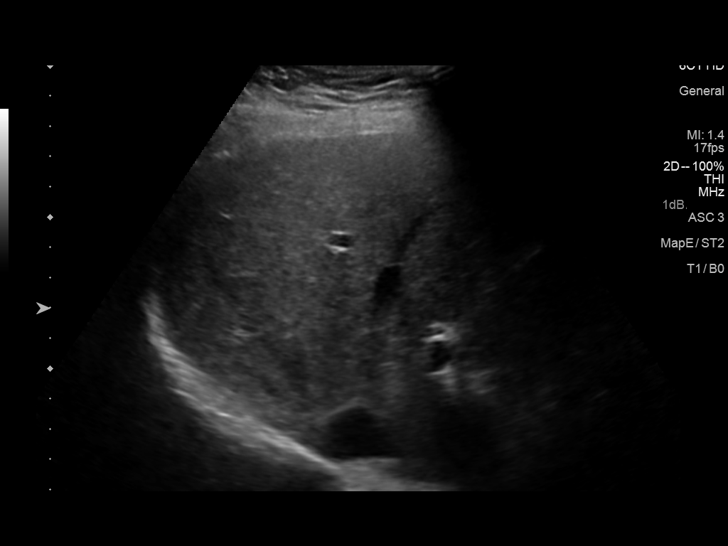
[im 35/52]
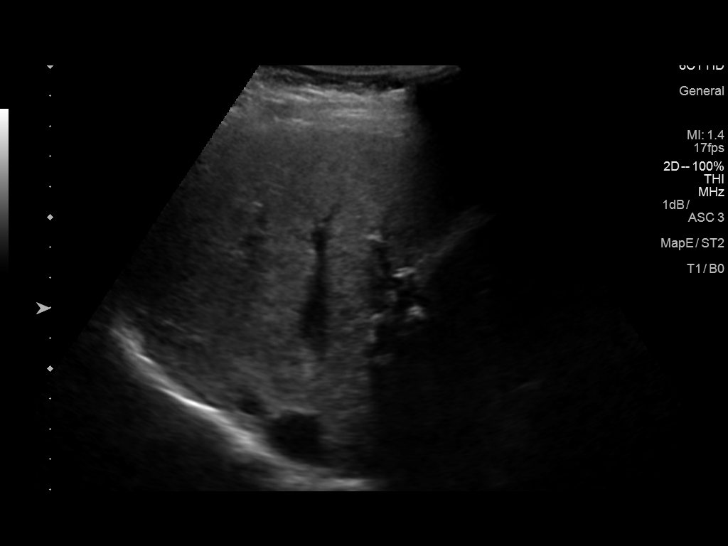
[im 39/52]
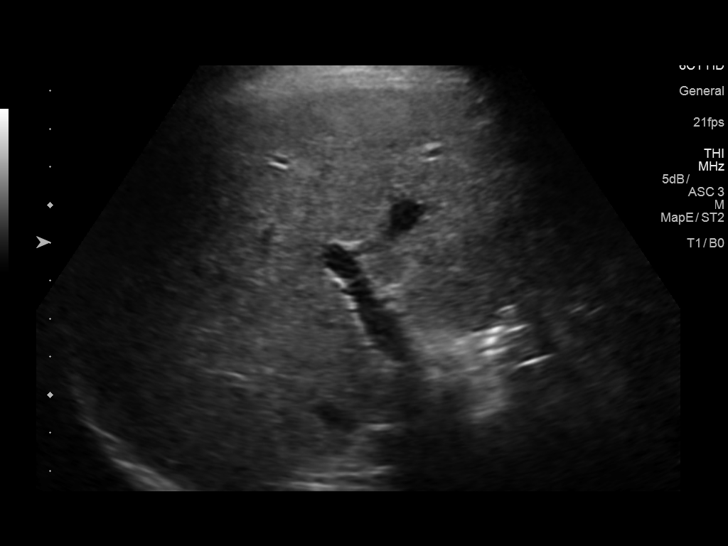
[im 43/52]
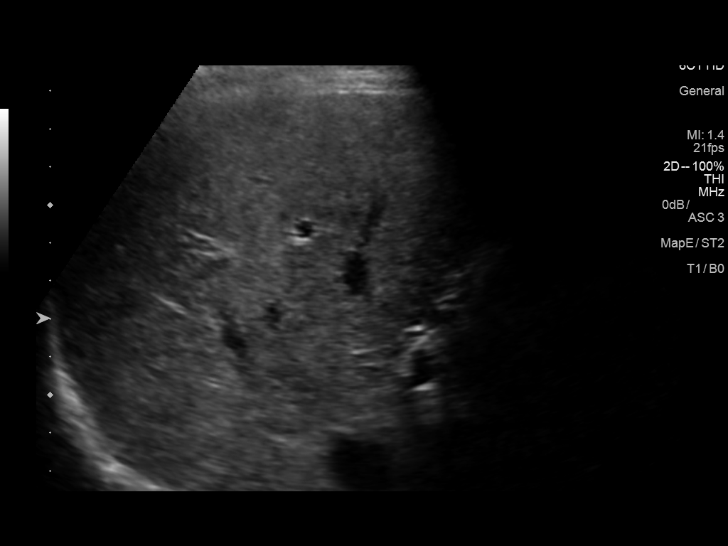
[im 47/52]
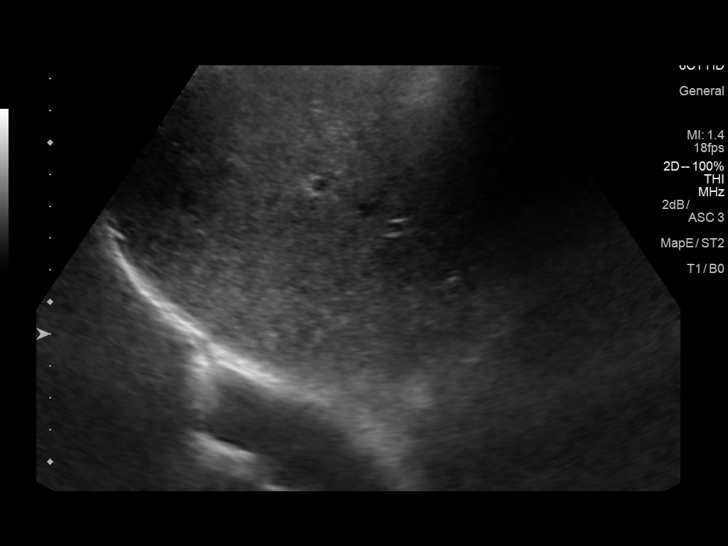
[im 52/52]
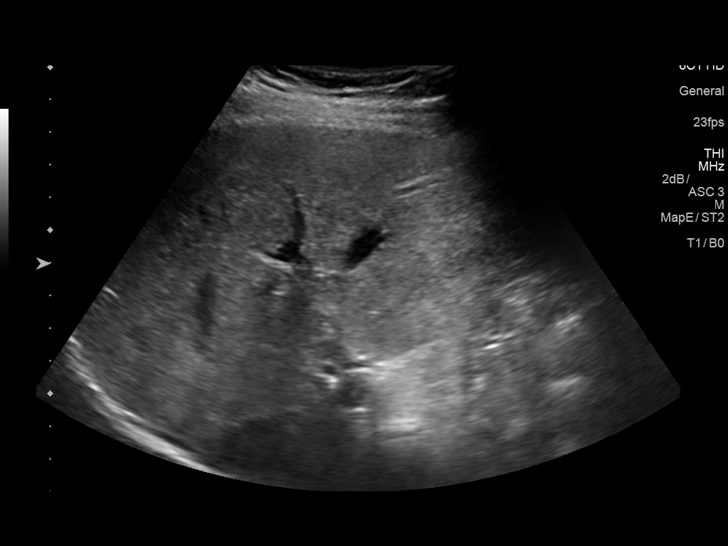

[14 of 25 positions shown; findings below may reference images not displayed]

FINDINGS: Gallbladder:

No gallstones or wall thickening visualized. No sonographic Murphy
sign noted by sonographer.

Common bile duct:

Diameter: 3.2 mm

Liver:

No focal lesion identified. Within normal limits in parenchymal
echogenicity. Portal vein is patent on color Doppler imaging with
normal direction of blood flow towards the liver.
IMPRESSION: No acute abnormality noted.

## 2023-03-03 LAB — HM MAMMOGRAPHY

## 2023-09-01 ENCOUNTER — Ambulatory Visit: Admitting: Urgent Care

## 2023-09-01 ENCOUNTER — Encounter: Payer: Self-pay | Admitting: Urgent Care

## 2023-09-01 VITALS — BP 132/78 | HR 54 | Ht 67.0 in | Wt 147.1 lb

## 2023-09-01 DIAGNOSIS — R7309 Other abnormal glucose: Secondary | ICD-10-CM | POA: Diagnosis not present

## 2023-09-01 DIAGNOSIS — D649 Anemia, unspecified: Secondary | ICD-10-CM | POA: Diagnosis not present

## 2023-09-01 DIAGNOSIS — H44812 Hemophthalmos, left eye: Secondary | ICD-10-CM

## 2023-09-01 LAB — POCT GLYCOSYLATED HEMOGLOBIN (HGB A1C)
HbA1c POC (<> result, manual entry): 5.6 % (ref 4.0–5.6)
HbA1c, POC (controlled diabetic range): 5.6 % (ref 0.0–7.0)
HbA1c, POC (prediabetic range): 5.6 % — AB (ref 5.7–6.4)
Hemoglobin A1C: 5.6 % (ref 4.0–5.6)

## 2023-09-01 NOTE — Patient Instructions (Addendum)
 We completed your screening today.  Your A1C is 5.6% which is normal. Your blood pressure recheck is normal.  We will call you with the results of your lab tests. Let us know at a future visit if you would like to proceed with the advanced cardiovascular screening profile.  Please schedule an annual physical in one year, return sooner as needed. Please sign a record release to obtain mammogram, colonoscopy and cervical cancer screening reports from your gynecologist office.

## 2023-09-01 NOTE — Progress Notes (Signed)
 New Patient Office Visit  Subjective:  Patient ID: Madeline Russell, female    DOB: January 20, 1961  Age: 63 y.o. MRN: [id number]  CC:  Chief Complaint  Patient presents with   Establish Care    New pt est care. Pt sees [facility] for paps and mammograms. She was told by her optometrist that she may have cardiovascular disease      HPI Madeline Russell presents to establish care. She prefers to be called "[name]."  The patient presents primarily due to the persistence of her eye doctor. Pt was seen by the specialist and diagnosed with a broken blood vessel in the left eye. She was referred by her eye doctor for evaluation due to concerns that it was caused from possible diabetes or heart disease.  During a routine annual eye exam, a broken blood vessel was discovered in her left eye. The eye doctor initially suggested it could be due to sneezing or possibly glaucoma. However, after a follow-up three weeks later, glaucoma was ruled out as her eye pressure and peripheral vision were normal. The broken blood vessel had decreased in size but had not resolved completely.  She has a history of elevated blood sugar levels, with A1c values ranging from 5.9 to 6.1 over the past three years. She is not diabetic, but her sugar levels have been slightly elevated. She regularly monitors her health with annual check-ups at her gynecologist, including blood work.  She has a family history of heart disease; her father died at 82 from heart complications and was a diabetic, and her brother has had a heart attack. Her mother died at 42 from leukemia, and she is not aware of any heart disease on her mother's side.  She does not smoke and maintains an active lifestyle, including regular walking and hiking. She takes a multivitamin, omega-3, and vitamin D supplements. She occasionally uses meloxicam for L shoulder pain, particularly after physical activities like changing oil or mowing the lawn. She has stopped  using Claritin due to concerns about its impact on blood pressure.   She was told recently that she was unable to give blood due to too low of hemoglobin. She states this has occurred in the past as well and was told it was due to being a runner.      Outpatient Encounter Medications as of 09/01/2023  Medication Sig   cholecalciferol (VITAMIN D) 1000 UNITS tablet Take 1,000 Units by mouth daily.   Multiple Vitamin (MULITIVITAMIN WITH MINERALS) TABS Take 1 tablet by mouth daily.   omega-3 acid ethyl esters (LOVAZA) 1 g capsule Take 350 mg by mouth daily.   [DISCONTINUED] loratadine (CLARITIN) 10 MG tablet Take 10 mg by mouth daily as needed. For allergies.   [DISCONTINUED] diclofenac (VOLTAREN) 75 MG EC tablet Take 75 mg by mouth daily. (Patient not taking: Reported on 09/01/2023)   [DISCONTINUED] diphenhydrAMINE (BENADRYL) 25 MG tablet Take 25 mg by mouth every 6 (six) hours as needed. Reported on 08/18/2015 (Patient not taking: Reported on 09/01/2023)   [DISCONTINUED] fluticasone (FLONASE) 50 MCG/ACT nasal spray Place 1 spray into both nostrils daily. (Patient not taking: Reported on 09/01/2023)   [DISCONTINUED] omeprazole (PRILOSEC) 20 MG capsule Take 1 capsule (20 mg total) by mouth daily. (Patient not taking: Reported on 09/01/2023)   No facility-administered encounter medications on file as of 09/01/2023.    Past Medical History:  Diagnosis Date   Breast lump 06/22/2007   right   Chicken pox  Hyperlipidemia    Prediabetes    Prediabetes     Past Surgical History:  Procedure Laterality Date   BREAST LUMPECTOMY     NOT CANCER   BREAST LUMPECTOMY  2009   right   BREAST SURGERY  2009   RIGHT    Family History  Problem Relation Age of Onset   Cancer Mother        leukemia   Heart disease Father     Social History   Socioeconomic History   Marital status: Single    Spouse name: Not on file   Number of children: Not on file   Years of education: Not on file   Highest  education level: Not on file  Occupational History   Not on file  Tobacco Use   Smoking status: Never   Smokeless tobacco: Never  Substance and Sexual Activity   Alcohol use: No   Drug use: No   Sexual activity: Not Currently  Other Topics Concern   Not on file  Social History Narrative   Not on file   Social Drivers of Health   Financial Resource Strain: Not on file  Food Insecurity: Not on file  Transportation Needs: Not on file  Physical Activity: Not on file  Stress: Not on file  Social Connections: Not on file  Intimate Partner Violence: Not on file    ROS: as noted in HPI  Objective:  BP 132/78   Pulse (!) 54   Ht 5\' 7"  (1.702 m)   Wt 147 lb 1.9 oz (66.7 kg)   SpO2 99%   BMI 23.04 kg/m   Physical Exam Vitals and nursing note reviewed.  Constitutional:      General: She is not in acute distress.    Appearance: Normal appearance. She is not ill-appearing, toxic-appearing or diaphoretic.  HENT:     Head: Normocephalic and atraumatic.     Right Ear: Tympanic membrane, ear canal and external ear normal. There is no impacted cerumen.     Left Ear: Tympanic membrane, ear canal and external ear normal. There is no impacted cerumen.     Nose: Nose normal.     Mouth/Throat:     Mouth: Mucous membranes are moist.     Pharynx: Oropharynx is clear. No oropharyngeal exudate or posterior oropharyngeal erythema.  Eyes:     General: No scleral icterus.       Right eye: No discharge.        Left eye: No discharge.     Extraocular Movements: Extraocular movements intact.     Pupils: Pupils are equal, round, and reactive to light.  Neck:     Thyroid: No thyroid mass, thyromegaly or thyroid tenderness.  Cardiovascular:     Rate and Rhythm: Normal rate and regular rhythm.     Pulses: Normal pulses.     Heart sounds: No murmur heard. Pulmonary:     Effort: Pulmonary effort is normal. No respiratory distress.     Breath sounds: Normal breath sounds. No stridor. No  wheezing or rhonchi.  Musculoskeletal:     Cervical back: Normal range of motion and neck supple. No rigidity or tenderness.     Right lower leg: No edema.     Left lower leg: No edema.  Lymphadenopathy:     Cervical: No cervical adenopathy.  Skin:    General: Skin is warm and dry.     Coloration: Skin is not jaundiced.     Findings: No bruising, erythema or rash.  Neurological:     General: No focal deficit present.     Mental Status: She is alert and oriented to person, place, and time.     Sensory: No sensory deficit.     Motor: No weakness.  Psychiatric:        Mood and Affect: Mood normal.        Behavior: Behavior normal.     Last CBC Lab Results  Component Value Date   WBC 4.6 05/14/2013   HGB 13.5 05/14/2013   HCT 40.0 05/14/2013   MCV 93.2 05/14/2013   MCH 31.5 05/14/2013   RDW 12.1 05/14/2013   PLT 265 05/14/2013   Last metabolic panel Lab Results  Component Value Date   GLUCOSE 111 (H) 05/14/2013   NA 140 05/14/2013   K 3.7 05/14/2013   CL 104 05/14/2013   CO2 25 05/14/2013   BUN 13 05/14/2013   CREATININE 0.70 05/14/2013   CALCIUM 9.2 05/14/2013   Last lipids No results found for: "CHOL", "HDL", "LDLCALC", "LDLDIRECT", "TRIG", "CHOLHDL" Last hemoglobin A1c Lab Results  Component Value Date   HGBA1C 5.6 09/01/2023   HGBA1C 5.6 09/01/2023   HGBA1C 5.6 (A) 09/01/2023   HGBA1C 5.6 09/01/2023   Last thyroid functions No results found for: "TSH", "T3TOTAL", "T4TOTAL", "THYROIDAB" Last vitamin D No results found for: "25OHVITD2", "25OHVITD3", "VD25OH" Last vitamin B12 and Folate No results found for: "VITAMINB12", "FOLATE"    Assessment & Plan:  Abnormal glucose -     POCT glycosylated hemoglobin (Hb A1C) -     CBC with Differential/Platelet -     Hemoglobin A1c -     TSH -     Lipid panel -     Comprehensive metabolic panel  Ocular hemorrhage, left -     CBC with Differential/Platelet -     Hemoglobin A1c -     TSH -     Lipid panel -      Comprehensive metabolic panel  Anemia, unspecified type -     Iron, TIBC and Ferritin Panel -     B12 and Folate Panel  Assessment and Plan    Ocular Hemorrhage Ocular hemorrhage in the left eye, decreased in size. Glaucoma ruled out. Evaluation for systemic causes needed. - Re-checked blood pressure in office, WNL. - Order in-office A1c test. - Consider advanced genetic cardio profile if lipid panel is abnormal. - Discuss hereditary heart disease and consider genetic cardio profile if indicated.  Elevated BP Initial elevated blood pressure likely due to anxiety, improved on repeat. No personal history of hypertension. Lifestyle factors in place. - Check blood pressure. - Encourage continued physical activity and dietary modifications. -mild bradycardia noted likely secondary to patients activity level  Elevated A1c A1c slightly elevated for three years, below diabetes threshold. Lifestyle changes have led to weight loss. - Order in-office A1c test - was normal at 5.6% - Continue lifestyle modifications to manage A1c levels.  General Health Maintenance Regular health screenings and balanced diet with physical activity. Family history of heart disease noted. - Continue regular health screenings including mammograms and colonoscopies. - Encourage continued physical activity and healthy diet.        Return in about 1 year (around 08/31/2024) for Annual Physical.   Maretta Bees, PA

## 2023-09-02 LAB — IRON,TIBC AND FERRITIN PANEL
%SAT: 21 % (ref 16–45)
Ferritin: 5 ng/mL — ABNORMAL LOW (ref 16–232)
Iron: 98 ug/dL (ref 45–160)
TIBC: 464 ug/dL — ABNORMAL HIGH (ref 250–450)

## 2023-09-02 LAB — LIPID PANEL
Cholesterol: 242 mg/dL — ABNORMAL HIGH (ref 0–200)
HDL: 80.1 mg/dL (ref >39.00)
LDL Cholesterol: 148 mg/dL — ABNORMAL HIGH (ref 0–99)
NonHDL: 162.09
Total CHOL/HDL Ratio: 3
Triglycerides: 69 mg/dL (ref 0.0–149.0)
VLDL: 13.8 mg/dL (ref 0.0–40.0)

## 2023-09-02 LAB — CBC WITH DIFFERENTIAL/PLATELET
Basophils Absolute: 0.1 10*3/uL (ref 0.0–0.1)
Basophils Relative: 1.3 % (ref 0.0–3.0)
Eosinophils Absolute: 0 10*3/uL (ref 0.0–0.7)
Eosinophils Relative: 1.1 % (ref 0.0–5.0)
HCT: 40.5 % (ref 36.0–46.0)
Hemoglobin: 13.1 g/dL (ref 12.0–15.0)
Lymphocytes Relative: 33.5 % (ref 12.0–46.0)
Lymphs Abs: 1.5 10*3/uL (ref 0.7–4.0)
MCHC: 32.4 g/dL (ref 30.0–36.0)
MCV: 91.6 fl (ref 78.0–100.0)
Monocytes Absolute: 0.4 10*3/uL (ref 0.1–1.0)
Monocytes Relative: 9.9 % (ref 3.0–12.0)
Neutro Abs: 2.4 10*3/uL (ref 1.4–7.7)
Neutrophils Relative %: 54.2 % (ref 43.0–77.0)
Platelets: 324 10*3/uL (ref 150.0–400.0)
RBC: 4.42 Mil/uL (ref 3.87–5.11)
RDW: 17.3 % — ABNORMAL HIGH (ref 11.5–15.5)
WBC: 4.5 10*3/uL (ref 4.0–10.5)

## 2023-09-02 LAB — COMPREHENSIVE METABOLIC PANEL
ALT: 13 U/L (ref 0–35)
AST: 17 U/L (ref 0–37)
Albumin: 4.7 g/dL (ref 3.5–5.2)
Alkaline Phosphatase: 64 U/L (ref 39–117)
BUN: 18 mg/dL (ref 6–23)
CO2: 28 meq/L (ref 19–32)
Calcium: 9.9 mg/dL (ref 8.4–10.5)
Chloride: 102 meq/L (ref 96–112)
Creatinine, Ser: 0.79 mg/dL (ref 0.40–1.20)
GFR: 81.4 mL/min (ref >60.00)
Glucose, Bld: 88 mg/dL (ref 70–99)
Potassium: 4.2 meq/L (ref 3.5–5.1)
Sodium: 139 meq/L (ref 135–145)
Total Bilirubin: 0.5 mg/dL (ref 0.2–1.2)
Total Protein: 7.2 g/dL (ref 6.0–8.3)

## 2023-09-02 LAB — B12 AND FOLATE PANEL
Folate: >25.2 ng/mL (ref >5.9)
Vitamin B-12: 420 pg/mL (ref 211–911)

## 2023-09-02 LAB — TSH: TSH: 1.68 u[IU]/mL (ref 0.35–5.50)

## 2023-09-02 LAB — HEMOGLOBIN A1C: Hgb A1c MFr Bld: 6 % (ref 4.6–6.5)

## 2023-09-05 ENCOUNTER — Other Ambulatory Visit: Payer: Self-pay | Admitting: Urgent Care

## 2023-09-05 DIAGNOSIS — R7303 Prediabetes: Secondary | ICD-10-CM

## 2023-09-05 DIAGNOSIS — E7800 Pure hypercholesterolemia, unspecified: Secondary | ICD-10-CM

## 2023-09-05 MED ORDER — PRAVASTATIN SODIUM 20 MG PO TABS: 20.0000 mg | ORAL_TABLET | Freq: Every evening | ORAL | 1 refills | Status: AC

## 2023-09-08 ENCOUNTER — Encounter: Payer: Self-pay | Admitting: Obstetrics

## 2023-09-12 ENCOUNTER — Telehealth: Payer: Self-pay

## 2023-09-12 ENCOUNTER — Encounter: Payer: Self-pay | Admitting: Urgent Care

## 2023-09-12 DIAGNOSIS — E7800 Pure hypercholesterolemia, unspecified: Secondary | ICD-10-CM

## 2023-09-12 NOTE — Telephone Encounter (Signed)
 Please advise.   Copied from CRM [id number]. Topic: General - Other >> Sep 12, 2023 10:15 AM Aletta Edouard wrote: Reason for CRM: patient would like a call back regarding her medication pravastatin (PRAVACHOL) 20 MG tablet she wants to know if she needs to start back taking she stopped the medication due to it making her feel sick

## 2023-09-13 MED ORDER — EZETIMIBE 10 MG PO TABS: 10.0000 mg | ORAL_TABLET | Freq: Every evening | ORAL | 0 refills | Status: DC | PRN

## 2023-12-13 ENCOUNTER — Other Ambulatory Visit: Payer: Self-pay

## 2023-12-13 DIAGNOSIS — E7800 Pure hypercholesterolemia, unspecified: Secondary | ICD-10-CM

## 2023-12-13 MED ORDER — EZETIMIBE 10 MG PO TABS: 10.0000 mg | ORAL_TABLET | Freq: Every evening | ORAL | 0 refills | Status: DC | PRN

## 2024-03-13 ENCOUNTER — Other Ambulatory Visit: Payer: Self-pay | Admitting: Urgent Care

## 2024-03-13 DIAGNOSIS — E7800 Pure hypercholesterolemia, unspecified: Secondary | ICD-10-CM

## 2024-03-29 ENCOUNTER — Other Ambulatory Visit: Payer: Self-pay | Admitting: Urgent Care

## 2024-03-29 DIAGNOSIS — E7800 Pure hypercholesterolemia, unspecified: Secondary | ICD-10-CM

## 2024-03-29 NOTE — Telephone Encounter (Unsigned)
 Copied from CRM [id number]. Topic: Clinical - Medication Refill >> Mar 29, 2024  8:12 AM Madeline Russell wrote: Medication: ezetimibe  (ZETIA ) 10 MG tablet  Has the patient contacted their pharmacy? Yes, call dr  This is the patient's preferred pharmacy:  [id number] - [place], [place] - [address] [place] [address] [place] [address] [place] [address] Phone: [phone] Fax: [phone]  Is this the correct pharmacy for this prescription? Yes If no, delete pharmacy and type the correct one.   Has the prescription been filled recently? No  Is the patient out of the medication? No  Has the patient been seen for an appointment in the last year OR does the patient have an upcoming appointment? Yes  Can we respond through MyChart? No  Agent: Please be advised that Rx refills may take up to 3 business days. We ask that you follow-up with your pharmacy.

## 2024-03-30 MED ORDER — EZETIMIBE 10 MG PO TABS: 10.0000 mg | ORAL_TABLET | Freq: Every evening | ORAL | 0 refills | Status: DC | PRN

## 2024-07-22 ENCOUNTER — Other Ambulatory Visit: Payer: Self-pay | Admitting: Urgent Care

## 2024-07-22 DIAGNOSIS — E7800 Pure hypercholesterolemia, unspecified: Secondary | ICD-10-CM
